# Patient Record
Sex: Female | Born: 1945 | Race: White | Hispanic: No | Marital: Single
Health system: Southern US, Community
[De-identification: ages and names within clinical notes are randomized; demographics above are authoritative.]

---

## 2005-06-22 ENCOUNTER — Ambulatory Visit: Payer: Self-pay | Admitting: Internal Medicine

## 2005-10-23 ENCOUNTER — Other Ambulatory Visit: Payer: Self-pay

## 2005-10-23 ENCOUNTER — Emergency Department: Payer: Self-pay | Admitting: Emergency Medicine

## 2005-10-25 ENCOUNTER — Ambulatory Visit: Payer: Self-pay | Admitting: Emergency Medicine

## 2005-11-03 ENCOUNTER — Ambulatory Visit: Payer: Self-pay | Admitting: Internal Medicine

## 2007-11-19 ENCOUNTER — Emergency Department: Payer: Self-pay | Admitting: Emergency Medicine

## 2009-09-24 ENCOUNTER — Ambulatory Visit: Payer: Self-pay | Admitting: Internal Medicine

## 2009-09-28 ENCOUNTER — Inpatient Hospital Stay: Payer: Self-pay | Admitting: Internal Medicine

## 2009-10-24 ENCOUNTER — Ambulatory Visit: Payer: Self-pay | Admitting: Internal Medicine

## 2009-11-03 ENCOUNTER — Ambulatory Visit: Payer: Self-pay | Admitting: Internal Medicine

## 2009-11-24 ENCOUNTER — Ambulatory Visit: Payer: Self-pay | Admitting: Internal Medicine

## 2011-07-05 ENCOUNTER — Ambulatory Visit: Payer: Self-pay | Admitting: Internal Medicine

## 2011-07-09 ENCOUNTER — Inpatient Hospital Stay: Payer: Self-pay | Admitting: Specialist

## 2011-07-09 LAB — CBC
HGB: 13.2 g/dL (ref 12.0–16.0)
MCH: 28.8 pg (ref 26.0–34.0)
MCHC: 32.3 g/dL (ref 32.0–36.0)
MCV: 89 fL (ref 80–100)
Platelet: 338 10*3/uL (ref 150–440)
RBC: 4.57 10*6/uL (ref 3.80–5.20)
RDW: 16.7 % — ABNORMAL HIGH (ref 11.5–14.5)

## 2011-07-09 LAB — BASIC METABOLIC PANEL
BUN: 7 mg/dL (ref 7–18)
Co2: 35 mmol/L — ABNORMAL HIGH (ref 21–32)
Creatinine: 0.76 mg/dL (ref 0.60–1.30)
EGFR (African American): >60
EGFR (Non-African Amer.): >60
Glucose: 88 mg/dL (ref 65–99)
Potassium: 3.9 mmol/L (ref 3.5–5.1)
Sodium: 145 mmol/L (ref 136–145)

## 2011-07-09 LAB — CK TOTAL AND CKMB (NOT AT ARMC)
CK, Total: 26 U/L (ref 21–215)
CK-MB: 1.1 ng/mL (ref 0.5–3.6)

## 2011-07-09 LAB — TROPONIN I: Troponin-I: 0.05 ng/mL

## 2011-07-10 LAB — CBC WITH DIFFERENTIAL/PLATELET
Basophil #: 0 10*3/uL (ref 0.0–0.1)
Lymphocyte %: 24.5 %
MCHC: 31.7 g/dL — ABNORMAL LOW (ref 32.0–36.0)
Monocyte %: 1.3 %
Neutrophil #: 5 10*3/uL (ref 1.4–6.5)
Neutrophil %: 74.1 %
Platelet: 325 10*3/uL (ref 150–440)
RBC: 4.22 10*6/uL (ref 3.80–5.20)
RDW: 16.5 % — ABNORMAL HIGH (ref 11.5–14.5)
WBC: 6.8 10*3/uL (ref 3.6–11.0)

## 2011-07-10 LAB — BASIC METABOLIC PANEL
BUN: 7 mg/dL (ref 7–18)
Chloride: 103 mmol/L (ref 98–107)
Co2: 31 mmol/L (ref 21–32)
Creatinine: 0.71 mg/dL (ref 0.60–1.30)
Glucose: 138 mg/dL — ABNORMAL HIGH (ref 65–99)
Osmolality: 285 (ref 275–301)
Potassium: 4.2 mmol/L (ref 3.5–5.1)
Sodium: 143 mmol/L (ref 136–145)

## 2011-07-10 LAB — TROPONIN I: Troponin-I: 0.03 ng/mL

## 2011-07-10 LAB — CK TOTAL AND CKMB (NOT AT ARMC): CK-MB: 2.1 ng/mL (ref 0.5–3.6)

## 2011-07-11 LAB — CBC WITH DIFFERENTIAL/PLATELET
Basophil %: 0.2 %
Eosinophil #: 0 10*3/uL (ref 0.0–0.7)
Eosinophil %: 0 %
HCT: 36.7 % (ref 35.0–47.0)
Lymphocyte %: 15.1 %
MCV: 89 fL (ref 80–100)
Neutrophil #: 8.9 10*3/uL — ABNORMAL HIGH (ref 1.4–6.5)
Neutrophil %: 80.3 %
Platelet: 363 10*3/uL (ref 150–440)
RBC: 4.14 10*6/uL (ref 3.80–5.20)
WBC: 11.1 10*3/uL — ABNORMAL HIGH (ref 3.6–11.0)

## 2011-07-11 LAB — BASIC METABOLIC PANEL
Anion Gap: 6 — ABNORMAL LOW (ref 7–16)
Calcium, Total: 8.7 mg/dL (ref 8.5–10.1)
Co2: 33 mmol/L — ABNORMAL HIGH (ref 21–32)
Creatinine: 0.82 mg/dL (ref 0.60–1.30)
EGFR (African American): >60
Osmolality: 280 (ref 275–301)
Sodium: 140 mmol/L (ref 136–145)

## 2011-07-12 LAB — BASIC METABOLIC PANEL
Anion Gap: 6 — ABNORMAL LOW (ref 7–16)
Calcium, Total: 8.6 mg/dL (ref 8.5–10.1)
Chloride: 99 mmol/L (ref 98–107)
Osmolality: 282 (ref 275–301)
Potassium: 3.5 mmol/L (ref 3.5–5.1)

## 2011-07-14 LAB — CBC WITH DIFFERENTIAL/PLATELET
Basophil #: 0 10*3/uL (ref 0.0–0.1)
Basophil %: 0.4 %
Eosinophil %: 0 %
HGB: 12.1 g/dL (ref 12.0–16.0)
Lymphocyte #: 1.9 10*3/uL (ref 1.0–3.6)
MCH: 28.6 pg (ref 26.0–34.0)
MCV: 88 fL (ref 80–100)
Monocyte #: 0.7 10*3/uL (ref 0.0–0.7)
Monocyte %: 8.4 %
Neutrophil #: 6.1 10*3/uL (ref 1.4–6.5)
Neutrophil %: 69.3 %
RBC: 4.24 10*6/uL (ref 3.80–5.20)
WBC: 8.9 10*3/uL (ref 3.6–11.0)

## 2011-07-15 LAB — BASIC METABOLIC PANEL
Anion Gap: 10 (ref 7–16)
Calcium, Total: 8.3 mg/dL — ABNORMAL LOW (ref 8.5–10.1)
Chloride: 92 mmol/L — ABNORMAL LOW (ref 98–107)
Co2: 38 mmol/L — ABNORMAL HIGH (ref 21–32)
EGFR (African American): >60
EGFR (Non-African Amer.): >60
Glucose: 73 mg/dL (ref 65–99)
Osmolality: 282 (ref 275–301)
Potassium: 2.7 mmol/L — ABNORMAL LOW (ref 3.5–5.1)

## 2011-07-16 LAB — BASIC METABOLIC PANEL
Anion Gap: 10 (ref 7–16)
BUN: 31 mg/dL — ABNORMAL HIGH (ref 7–18)
Chloride: 94 mmol/L — ABNORMAL LOW (ref 98–107)
Co2: 39 mmol/L — ABNORMAL HIGH (ref 21–32)
Creatinine: 0.89 mg/dL (ref 0.60–1.30)
EGFR (African American): >60
EGFR (Non-African Amer.): >60
Glucose: 76 mg/dL (ref 65–99)
Osmolality: 290 (ref 275–301)
Sodium: 143 mmol/L (ref 136–145)

## 2011-07-16 LAB — CULTURE, BLOOD (SINGLE)

## 2011-07-17 LAB — CBC WITH DIFFERENTIAL/PLATELET
Basophil #: 0.1 10*3/uL (ref 0.0–0.1)
Basophil %: 0.5 %
Eosinophil #: 0.2 10*3/uL (ref 0.0–0.7)
Eosinophil %: 1.9 %
HCT: 37.6 % (ref 35.0–47.0)
HGB: 12.2 g/dL (ref 12.0–16.0)
MCH: 28.6 pg (ref 26.0–34.0)
MCHC: 32.4 g/dL (ref 32.0–36.0)
MCV: 88 fL (ref 80–100)
Monocyte #: 1 10*3/uL — ABNORMAL HIGH (ref 0.0–0.7)
Neutrophil #: 6.1 10*3/uL (ref 1.4–6.5)
Neutrophil %: 53.3 %
RBC: 4.25 10*6/uL (ref 3.80–5.20)
RDW: 15.7 % — ABNORMAL HIGH (ref 11.5–14.5)

## 2011-07-17 LAB — BASIC METABOLIC PANEL
Anion Gap: 8 (ref 7–16)
Calcium, Total: 8.6 mg/dL (ref 8.5–10.1)
Chloride: 95 mmol/L — ABNORMAL LOW (ref 98–107)
Co2: 36 mmol/L — ABNORMAL HIGH (ref 21–32)
Glucose: 81 mg/dL (ref 65–99)
Osmolality: 283 (ref 275–301)
Potassium: 3.4 mmol/L — ABNORMAL LOW (ref 3.5–5.1)

## 2011-11-14 ENCOUNTER — Ambulatory Visit: Payer: Self-pay | Admitting: Internal Medicine

## 2012-06-24 ENCOUNTER — Ambulatory Visit: Payer: Self-pay | Admitting: Internal Medicine

## 2013-10-09 ENCOUNTER — Ambulatory Visit: Payer: Self-pay | Admitting: Internal Medicine

## 2014-01-02 ENCOUNTER — Ambulatory Visit: Payer: Self-pay | Admitting: Family Medicine

## 2014-01-09 ENCOUNTER — Emergency Department: Payer: Self-pay | Admitting: Emergency Medicine

## 2014-01-09 LAB — DRUG SCREEN, URINE
Amphetamines, Ur Screen: NEGATIVE (ref ?–1000)
BENZODIAZEPINE, UR SCRN: POSITIVE (ref ?–200)
Barbiturates, Ur Screen: NEGATIVE (ref ?–200)
COCAINE METABOLITE, UR ~~LOC~~: NEGATIVE (ref ?–300)
Cannabinoid 50 Ng, Ur ~~LOC~~: NEGATIVE (ref ?–50)
MDMA (Ecstasy)Ur Screen: NEGATIVE (ref ?–500)
Methadone, Ur Screen: NEGATIVE (ref ?–300)
Opiate, Ur Screen: POSITIVE (ref ?–300)
PHENCYCLIDINE (PCP) UR S: NEGATIVE (ref ?–25)
Tricyclic, Ur Screen: NEGATIVE (ref ?–1000)

## 2014-01-09 LAB — COMPREHENSIVE METABOLIC PANEL
ALK PHOS: 103 U/L
ANION GAP: 4 — AB (ref 7–16)
Albumin: 4.1 g/dL (ref 3.4–5.0)
BUN: 13 mg/dL (ref 7–18)
Bilirubin,Total: 0.5 mg/dL (ref 0.2–1.0)
CHLORIDE: 104 mmol/L (ref 98–107)
CREATININE: 1.29 mg/dL (ref 0.60–1.30)
Calcium, Total: 9.2 mg/dL (ref 8.5–10.1)
Co2: 27 mmol/L (ref 21–32)
EGFR (Non-African Amer.): 42 — ABNORMAL LOW
GFR CALC AF AMER: 49 — AB
GLUCOSE: 87 mg/dL (ref 65–99)
Osmolality: 270 (ref 275–301)
Potassium: 3.4 mmol/L — ABNORMAL LOW (ref 3.5–5.1)
SGOT(AST): 56 U/L — ABNORMAL HIGH (ref 15–37)
SGPT (ALT): 33 U/L (ref 12–78)
SODIUM: 135 mmol/L — AB (ref 136–145)
TOTAL PROTEIN: 7.9 g/dL (ref 6.4–8.2)

## 2014-01-09 LAB — CBC
HCT: 32.1 % — ABNORMAL LOW (ref 35.0–47.0)
HGB: 10.3 g/dL — AB (ref 12.0–16.0)
MCH: 23.4 pg — ABNORMAL LOW (ref 26.0–34.0)
MCHC: 32.1 g/dL (ref 32.0–36.0)
MCV: 73 fL — ABNORMAL LOW (ref 80–100)
PLATELETS: 629 10*3/uL — AB (ref 150–440)
RBC: 4.4 10*6/uL (ref 3.80–5.20)
RDW: 18 % — ABNORMAL HIGH (ref 11.5–14.5)
WBC: 12.7 10*3/uL — ABNORMAL HIGH (ref 3.6–11.0)

## 2014-01-09 LAB — URINALYSIS, COMPLETE
Bilirubin,UR: NEGATIVE
Blood: NEGATIVE
GLUCOSE, UR: NEGATIVE mg/dL (ref 0–75)
KETONE: NEGATIVE
Leukocyte Esterase: NEGATIVE
NITRITE: NEGATIVE
PH: 6 (ref 4.5–8.0)
Protein: NEGATIVE
RBC,UR: <1 /HPF (ref 0–5)
Specific Gravity: 1.006 (ref 1.003–1.030)
WBC UR: <1 /HPF (ref 0–5)

## 2014-01-09 LAB — SALICYLATE LEVEL: Salicylates, Serum: 4.3 mg/dL — ABNORMAL HIGH

## 2014-01-09 LAB — ETHANOL: Ethanol %: <0.003 % (ref 0.000–0.080)

## 2014-01-09 LAB — ACETAMINOPHEN LEVEL: Acetaminophen: 3 ug/mL — ABNORMAL LOW

## 2014-01-11 LAB — TROPONIN I
Troponin-I: <0.02 ng/mL
Troponin-I: <0.02 ng/mL

## 2014-01-30 ENCOUNTER — Emergency Department: Payer: Self-pay | Admitting: Emergency Medicine

## 2014-01-30 LAB — COMPREHENSIVE METABOLIC PANEL
ALK PHOS: 81 U/L
ANION GAP: 9 (ref 7–16)
AST: 19 U/L (ref 15–37)
Albumin: 3.8 g/dL (ref 3.4–5.0)
BUN: 19 mg/dL — ABNORMAL HIGH (ref 7–18)
Bilirubin,Total: 0.2 mg/dL (ref 0.2–1.0)
CHLORIDE: 102 mmol/L (ref 98–107)
Calcium, Total: 9.1 mg/dL (ref 8.5–10.1)
Co2: 30 mmol/L (ref 21–32)
Creatinine: 1.15 mg/dL (ref 0.60–1.30)
EGFR (African American): 56 — ABNORMAL LOW
GFR CALC NON AF AMER: 49 — AB
Glucose: 109 mg/dL — ABNORMAL HIGH (ref 65–99)
OSMOLALITY: 284 (ref 275–301)
POTASSIUM: 3.6 mmol/L (ref 3.5–5.1)
SGPT (ALT): 21 U/L
Sodium: 141 mmol/L (ref 136–145)
Total Protein: 7.2 g/dL (ref 6.4–8.2)

## 2014-01-30 LAB — CBC
HCT: 29.2 % — AB (ref 35.0–47.0)
HGB: 9.1 g/dL — AB (ref 12.0–16.0)
MCH: 22.3 pg — ABNORMAL LOW (ref 26.0–34.0)
MCHC: 31.3 g/dL — ABNORMAL LOW (ref 32.0–36.0)
MCV: 71 fL — ABNORMAL LOW (ref 80–100)
PLATELETS: 649 10*3/uL — AB (ref 150–440)
RBC: 4.09 10*6/uL (ref 3.80–5.20)
RDW: 17.6 % — AB (ref 11.5–14.5)
WBC: 14.2 10*3/uL — ABNORMAL HIGH (ref 3.6–11.0)

## 2014-01-30 LAB — ACETAMINOPHEN LEVEL: Acetaminophen: <2 ug/mL

## 2014-01-30 LAB — ETHANOL: Ethanol: <3 mg/dL

## 2014-01-30 LAB — SALICYLATE LEVEL: Salicylates, Serum: <1.7 mg/dL

## 2014-01-31 LAB — URINALYSIS, COMPLETE
BACTERIA: NONE SEEN
Bilirubin,UR: NEGATIVE
Blood: NEGATIVE
GLUCOSE, UR: NEGATIVE mg/dL (ref 0–75)
Ketone: NEGATIVE
Leukocyte Esterase: NEGATIVE
Nitrite: NEGATIVE
Ph: 8 (ref 4.5–8.0)
Protein: NEGATIVE
RBC,UR: NONE SEEN /HPF (ref 0–5)
Specific Gravity: 1.004 (ref 1.003–1.030)
WBC UR: NONE SEEN /HPF (ref 0–5)

## 2014-01-31 LAB — DRUG SCREEN, URINE

## 2014-02-02 LAB — TROPONIN I: Troponin-I: <0.02 ng/mL

## 2014-10-17 NOTE — Consult Note (Signed)
Brief Consult Note: Diagnosis: Bipolar Manic.   Patient was seen by consultant.   Recommend further assessment or treatment.   Comments: Pt seen in ED BHU. She appeared somewhat calm and has been responding well to the medication. She stated that she slept well and is not having any dizziness at this time. She continues to be verbose with some anxiety noted. She denied SI/HI or plans at this time. She has tolerated the Seroquel well and no adverse effects noted. She was taking Xanax in the past for anxiety.  Plan: Will add Buspar for anxiety. Continue Seroquel 200mg  po qhs Will continue to monitor.  Awaiting placement at geropsych unit.  Electronic Signatures: Rhunette CroftFaheem, Ulysse Siemen S (MD)  (Signed 23-Jul-15 11:32)  Authored: Brief Consult Note   Last Updated: 23-Jul-15 11:32 by Rhunette CroftFaheem, Achillies Buehl S (MD)

## 2014-10-17 NOTE — Consult Note (Signed)
PATIENT NAME:  Ashley Ayers, Ashley Ayers MR#:  [id number] DATE OF BIRTH:  10/20/1945  DATE OF CONSULTATION:  01/11/2014  CONSULTING PHYSICIAN:  Neeley Sedivy K. Exodus Kutzer, MD  SEX: Female.  RACE: White.  SUBJECTIVE: The patient was seen on consultation, room number 21A at [facility]. 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 The patient is divorced for many years and has no children. The patient reports that she has been living in a mixed neighborhood with several other people. The patient reports that they diagnosed her with COPD 6 months ago and put her on oxygen and then they came to know that she was allergic to cats and her roommate had a cat. The patient is currently using oxygen. According to information obtained from the chart, patient has been delusional, psychotic, and paranoid. The patient reports that she had to go back to the place as she is concerned about her problems related to allergies towards cats.  ALLERGIES: CATS.   PAST PSYCHIATRIC HISTORY: History of inpatient hold on psychiatry many years ago for depression and psychiatric problems. No history of suicide attempts, not being followed by any psychiatrist.   MENTAL STATUS EXAMINATION: The patient is alert and oriented to place, person, and time. Denies feeling depressed, denies feeling hopeless, helpless. Denies auditory or visual hallucinations. Denies any paranoid or suspicious ideas, could count money. She could spell the word world forward but had difficulty spelling it backward, memory and recall are fair and regarding judgment for fire, she said she will try to follow the instructions by the staff here. Insight and judgement are guarded. Impulse control is poor.   IMPRESSION: Psychosis, not otherwise specified, and delusional disorder to be ruled out.  RECOMMENDATION: Inpatient hospital  in   psychiatry   where geriatric healthcare is available and where oxygen will be accepted.  Placement is being looked into for appropriate admission and help as needed.   ____________________________ Jannet MantisSurya K. Guss Bundehalla, MD skc:lt D: 01/11/2014 19:15:19 ET T: 01/11/2014 23:29:54 ET JOB#: [id number]  cc: Monika SalkSurya K. Guss Bundehalla, MD, <Dictator> Beau FannySURYA K Gerritt Galentine MD ELECTRONICALLY SIGNED 01/17/2014 18:06

## 2014-10-17 NOTE — Consult Note (Signed)
PATIENT NAME:  Ashley Ayers, FETTING MR#:  [id number] DATE OF BIRTH:  Sep 16, 1945  DATE OF CONSULTATION:  01/31/2014  CONSULTING PHYSICIAN:  Tiawanna Luchsinger K. Guss Bunde, MD  PLACE OF DICTATION:  [facility], #22A, [place], [place].  AGE:  69 years.   SEX:  Female.  RACE: White  SUBJECTIVE:  The patient was seen in consultation, room #22A, [facility].  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  The patient reports her marriage was annulled and she has no children.  She has been living by herself in a room that she rents from a lady in [place] around [place], and she has been living in this situation for 1 year and she calls it a good deal as she rents a room for $400 from a lady.  According to information obtained from the chart and from the staff, the patient had been inpatient in [facility] where she was observed for 7 days and was discharged and on her way back, she started talking "out of her head."  According to the information obtained, the patient had been paranoid and delusional.    PAST PSYCHIATRIC HISTORY:  History of bipolar disorder wherein the patient was diagnosed bipolar Disorder years and she reports that she is suffering from bipolar disorder and she has been taking care of it for many years, and in fact, she is being followed by Dr. Janeece Riggers at [facility] in [place], [place].  She is being followed by him on a regular basis for many years and she has to understand to call him and find out the details that she takes all the medications that are prescribed by him.  No history of suicide attempts.    ALCOHOL AND DRUGS:  Denied.  Does admit smoking nicotine cigarettes, which he just quit and she stated that she just quit smoking nicotine cigarettes because of her COPD and being on oxygen and she wanted undersigned to call Dr. Janeece Riggers and tell him so.  MENTAL  STATUS EXAMINATION:  The patient is dressed in hospital scrubs alert and oriented to place, person and time.  Pressured speech with racing thoughts.  When her age was asked, she stated that date of birth was 08/01/1945 and she smiled and stated that this was her birthday month.  She gets irritable and agitated when questions are asked.  Not able to focus, pay attention.  The same question had to be asked several times.  Denies feeling depressed.  Denies feeling hopeless or helpless.  Admits that she is paranoid and she is always that way and whenever she gets paranoid, she tries to check it out.  She tries to find out why she is paranoid and she is delusional about people around and does not trust many people around.  Cognition is intact.  She knew [address], [place], name of the current president.  Speech is pressured and jumps from 1 subject to the other.  Insight and judgment guarded.  Impulse control is poor, though she says she does not have any suicidal or homicidal ideas or plan, but she reports that she does not trust herself.  IMPRESSION:  Bipolar disorder type 1 with manic episode and psychosis.  Recommend inpatient hospital and psychiatry to a geriatric facility where the patient can be stabilized.  Continue current medications.   ____________________________ Jannet Mantis. Guss Bunde, MD skc:ds  D: 01/31/2014 19:49:22 ET T: 01/31/2014 20:37:24 ET JOB#: [id number]  cc: Monika SalkSurya K. Guss Bundehalla, MD, <Dictator> Beau FannySURYA K Ahmeer Tuman MD ELECTRONICALLY SIGNED 02/01/2014 19:21

## 2014-10-17 NOTE — Consult Note (Signed)
Psychiatry: Follow-up note for this patient with bipolar disorder.  Today she was trying to sleep in the afternoon but was easy to arouse.  She has no new complaints.  She appeared to still be psychotic and disorganized in her thinking.  Was rambling.  Talking about grandiose and bizarre things. Seroquel to 300 mg at night for psychosis with bipolar mania.  Patient needs admission to geriatric psychiatry unit.  Recommend the process of referral be continued.  Electronic Signatures: Audery Amellapacs, John T (MD)  (Signed on 24-Jul-15 16:42)  Authored  Last Updated: 24-Jul-15 16:42 by Audery Amellapacs, John T (MD)

## 2014-10-17 NOTE — Consult Note (Signed)
PATIENT NAME:  Ashley Ayers, [name] MR#:  [id number] DATE OF BIRTH:  May 08, 1946  DATE OF CONSULTATION:  01/14/2014  REFERRING PHYSICIAN:   CONSULTING PHYSICIAN:  Audery AmelJohn T. Clapacs, MD  IDENTIFYING INFORMATION AND REASON FOR CONSULT: This is a 69 year old woman currently under involuntary commitment in the Emergency Room with reports that she has been threatening, psychotic, acting bizarre, talking about shooting people. She was petitioned by someone in her neighborhood. She was seen a couple of days ago by Dr. Guss Bundehalla, who believed that she was psychotic and recommended that the patient be referred to a geriatric psychiatry hospital. I checked up with the patient today and she told me that she was "manic depressive." She seemed to have pretty good insight about it saying that she still knew that she needed to be in the hospital and needed psychiatric treatment because her mind was not working right and she was manic. Unfortunately, she was not able to stay focused enough on conversation to really give me a clear history of it. She describes a lot of grandiose bizarre thinking and behavior at home. She has a hard time answering many specific questions. I asked her what medication she was currently taking, and after asking her several times, I finally got her to tell me that she is taking Seroquel, but she could not tell me the dose. She also apparently is taking mirtazapine, but does not know any other medicines she is taking. She claims that she is still seeing Dr. [name] for outpatient treatment, but cannot remember when she last saw him. She tells me that she did not used to have a substance abuse problem, but recently she has been drinking. Denies any other drug use.   PAST PSYCHIATRIC HISTORY: She has been seen in consult once before at our hospital, at which time, Dr. (Dictation Anomaly) << MISSING TEXT>> saw her as primarily depressed. The patient, herself, tells me today that she has had mental problems since  she was a child and has been told that she was bipolar and schizophrenic at various times. She has been on a wide range of antipsychotics, antidepressants and mood stabilizing medicines over the years. She is not really able to tell me exactly what medicines or whether any of it was particularly helpful. Denies ever having tried to kill herself.   PAST MEDICAL HISTORY: She has chronic obstructive pulmonary disease and is oxygen dependent now. She also has high blood pressure. History of heart disease, not clear whether she has been compliant with her medicine.   SOCIAL HISTORY: She tells a rambling story about how various people threw her out of the house. It is kind of hard to put the whole thing together, sounds like she has been living alone, recently. She reportedly had been threatening to shoot people in her neighborhood. Whether she actually has firearms in her home or not is unclear.   SUBSTANCE ABUSE HISTORY: As noted previously, she had denied this in the past, but says recently she has been drinking. Her drug screen is positive for opiates and benzodiazepines.   FAMILY HISTORY: She says that everyone in her family has "manic depression, schizogenic."   CURRENT MEDICATIONS: I am not sure how reliable this current list is, but we were told that she gets amlodipine 10 mg a day, aspirin 325 mg a day, fluoxetine 40 mg a day, Remeron 30 mg at night, potassium chloride 20 mEq a day, quetiapine 100 mg at night. Other medicines here, it is unclear, and she  is not able to tell me.   ALLERGIES: BACTRIM, CECLOR AND MORPHINE.   REVIEW OF SYSTEMS: She denies any acute physical symptoms. Denies any suicidal or homicidal ideation. Denies hallucinations. Admits that she feels like she has racing thoughts, and that she feels "manic."   MENTAL STATUS EXAMINATION: Thin older woman who looks older than her stated age. Very cooperative and enthusiastic about the interview. Intense eye contact. Fidgety  psychomotor activity. Rapid, pressured speech. Affect smiling and somewhat labile, a little euphoric. Mood stated as being manic. Thoughts are disorganized with a lot of a flight of ideas. Some grandiose thinking. Denies hallucinations. Denies suicidal or homicidal ideation. Judgment and insight at least somewhat retained in that she knows she has a mental illness. She is alert and oriented x 4. Could not participate in other cognitive testing.   LABORATORY RESULTS: Her drug screen on admission was positive for benzodiazepines and opiates. Chemistry panel: Low sodium 135, low potassium 3.4. Alcohol level negative.   ASSESSMENT: This is a 69 year old woman,  who to my examination, appears to have bipolar disorder, currently manic. She has been treated with antidepressants, which would not typically be the best treatment for this condition. According to the commitment paperwork, she has been dangerous in the community and I believe that. She still is manic at this point. I would admit her to the hospital, but unfortunately she is oxygen dependent.   TREATMENT PLAN: I have increased her Seroquel to 200 mg at night and stopped the Remeron and Prozac. She needs to be referred to a geriatric psychiatry unit.   DIAGNOSIS, PRINCIPAL AND PRIMARY:  AXIS I: Bipolar disorder type 1, manic.   SECONDARY DIAGNOSES:  AXIS I: Deferred.  AXIS II: Deferred.  AXIS III: Chronic obstructive pulmonary disease, high blood pressure.  AXIS IV: Moderate to severe from what seems like isolation.  AXIS V: Functioning at the time of evaluation, 30.    ____________________________ Audery Amel, MD jtc:jr D: 01/14/2014 16:12:00 ET T: 01/14/2014 16:31:31 ET JOB#: [id number]  cc: Audery Amel, MD, <Dictator> Audery Amel MD ELECTRONICALLY SIGNED 02/04/2014 0:38

## 2014-10-18 NOTE — Discharge Summary (Signed)
PATIENT NAME:  Ashley Ayers, Ashley Ayers MR#:  [id number] DATE OF BIRTH:  1945-07-24  DATE OF ADMISSION:  07/09/2011 DATE OF DISCHARGE:  07/23/2011  For a detailed note, please see the History and Physical done by Dr. Luberta Mutter.    Please also look at the interim discharge summary done by Dr. Lafayette Dragon, which covers hospital course from 01/13 until 01/20. This covers the hospital course from 01/20 until the day of discharge, 01/27.   DIAGNOSES AT DISCHARGE:  1. Acute on chronic respiratory failure secondary to underlying pneumonia and chronic obstructive pulmonary disease exacerbation.  2. Pneumonia, failed outpatient antibiotics, now much improved.  3. History of tobacco abuse.  4. History of hypertension.  5. History of diastolic congestive heart failure.  6. Schizoaffective disorder.  7. Hypokalemia, now resolved.  8. 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 Meredeth Ide.   CONSULTANTS DURING HOSPITAL COURSE: Dr. Meredeth Ide from pulmonary.  DISCHARGE MEDICATIONS:  1. Xanax 0.5 mg q.i.d. as needed.  2. Remeron 15 mg at bedtime.  3. Seroquel 50 mg at bedtime.  4. Aspirin 325 mg daily.  5. Advair 250/50 1 puff b.i.d.  6. Prozac 40 mg daily.  7. Multivitamin daily.  8. Toprol 25 mg daily.  9. Spiriva 1 puff daily.  10. Lasix 20 mg daily.  11. Daliresp 500 mcg daily.  12. Bacitracin neomycin topically b.i.d. times five days applied to the right ear.   DIET: The patient is being discharged on a low-sodium diet.   ACTIVITY: As tolerated.   FOLLOWUP: Follow up with Dr. Dewaine Oats in the next 1 to 2 weeks.   PERTINENT STUDIES: CT scan of the chest done on 07/09/2011 showing no pulmonary embolism. New and increased heterogeneous opacities in the periphery of the right upper lobe and right lower lobe, some of which demonstrate some spiculation. These are nonspecific and may represent infection superimposed on background fibrotic changes. Short-term  followup is recommended with a repeat CT in 2 to 3 months to ensure clearing. Mild enlarged right hilar lymph nodes which are nonspecific, may be reactive. 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   HOSPITAL COURSE:  1. Acute on chronic respiratory failure: The patient presented to the hospital with shortness of breath, cough, fever ongoing for the past month or so. She had failed outpatient p.o. antibiotic therapy for pneumonia. She was started on IV ceftriaxone and Zithromax in the hospital. She received 10 days of IV antibiotic therapy and also aggressive IV steroids and around-the-clock nebulizer treatments. After getting aggressive therapy, the patient's clinical symptoms significantly improved. She did qualify for oxygen therapy. Therefore she will be discharged on oxygen. She has also finished her prednisone taper for her chronic obstructive pulmonary disease and pneumonia. As mentioned, she did have a CT scan which showed some questionable findings with some spiculation, and she needs a follow-up CT in next 3 to 4 months.  2. Chronic obstructive pulmonary disease exacerbation: This was secondary to possible underlying pneumonia. She was treated aggressively with 10 days of IV antibiotic therapy along with IV steroids, around-the-clock nebulizer treatments and maintained on some Symbicort and Spiriva and also Daliresp. Clinical symptoms have significantly improved. She did require home oxygen and therefore will be discharged on oxygen therapy. She is currently is being discharged on maintenance medications for chronic obstructive pulmonary disease including Advair and Spiriva and Daliresp. As mentioned, she has finished antibiotic therapy and also p.o. prednisone taper.  3. Hypertension: The patient  has remained hemodynamically stable on some Toprol which she will continue.   4. Hyperglycemia: This was secondary to steroids. It has significantly improved since her steroids have been discontinued.   5. History of schizoaffective disorder: The patient has been maintained on her medications including her Seroquel, Remeron, Prozac, and p.r.n. Xanax. She will resume these at the skilled nursing facility.  6. Hypokalemia: This is secondary to Lasix therapy. It has improved with supplementation and currently now resolved.  7. Right otitis externa: The patient does have some redness to the right ear. She was started on some polymyxin bacitracin neomycin topical antibiotic yesterday. She will do that for the next five days to treat right otitis externa. She has remained afebrile and stable regarding this. She did not have any evidence of otitis media on exam.  8. History of diastolic congestive heart failure: During her respiratory failure in the hospital when the patient was not improving with conventional therapy, she did undergo echocardiogram which showed normal LV function. There was some concern that she may have some diastolic congestive heart failure from her severe chronic obstructive pulmonary disease. Therefore, she was given some pulse doses of Lasix to which she responded well and they were able to wean down her oxygen. She is currently being maintained on some Lasix 20 mg daily, which she will continue. She does not appear to be volume overloaded and does not appear to have any evidence of congestive heart failure presently.  9. CODE STATUS: The patient is a FULL CODE. 10. The patient was seen by physical therapy and they recommended she go to a skilled nursing facility. Therefore, [facility] is being [facility].    TIME SPENT ON DISCHARGE: 45 minutes.  ____________________________ Rolly PancakeVivek J. Cherlynn KaiserSainani, MD vjs:bjt D: 07/23/2011 12:48:30 ET T: 07/23/2011 13:45:01 ET JOB#: [id number]  cc: Rolly PancakeVivek J. Cherlynn KaiserSainani, MD, <Dictator> Jillene Bucksenny C. Arlana Pouchate, MD Houston SirenVIVEK J Dionna Wiedemann MD ELECTRONICALLY SIGNED 07/29/2011 8:21

## 2014-10-18 NOTE — H&P (Signed)
PATIENT NAME:  Ashley Ayers, SCHRODT MR#:  [id number] DATE OF BIRTH:  06-Apr-1946  DATE OF ADMISSION:  07/09/2011  PRIMARY CARE PHYSICIAN: Dr. Dewaine Oats   CHIEF COMPLAINT: Trouble breathing. Patient was seen by primary doctor and was started on Levaquin.   HISTORY OF PRESENT ILLNESS: 69 year old female with history of tobacco abuse, hypertension came in because of trouble breathing getting progressively worse for the past one month. Patient says that she is having trouble breathing for one month, getting worse in the last 2 to 3 days so she came to the ER today. Also has cough and pleuritic chest pain. She also noted some leg swelling with no orthopnea or PND. Complains of about 20 pounds weight loss in last one month. Patient was recently seen by primary doctor and was diagnosed with chronic obstructive pulmonary disease. Patient refused admission time according to documentation. Patient denies any chest pain. No dizziness. No anxiety and patient did not have any fever.   PAST MEDICAL HISTORY:  1. History of hypertension. 2. Chronic obstructive pulmonary disease. 3. Iron deficiency anemia. 4. 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  5. Admission in 2011 for pneumonia.  6. History of schizoaffective disorder.   PAST SURGICAL HISTORY: Significant for breast implants.   SOCIAL HISTORY: Still smokes. She used to smoke about a pack per day for 40 years, now she smokes about 1 to 2 cigarettes a day. No alcohol. No drugs.   ALLERGIES: Allergic to Ceclor, morphine. Morphine gives her hallucinations and rash. Ceclor give her hives.    FAMILY HISTORY: Significant for diabetes in mother. Father had history of stroke.    MEDICATIONS:  1. Advair Diskus 250/50, 1 puff b.i.d.  2. Xanax 0.5 mg p.o. t.i.d.  3. Amlodipine 5 mg daily. 4. Aspirin 325 mg p.o. daily.  5. Ativan 0.5 mg as needed. 6. Ferrex 150 mg p.o. daily.  7. Prozac 40 mg daily. 8. Singulair 10 mg  as needed.   10. Zyprexa 10 mg 0.5 mg in the morning and at night. Marland Kitchen   REVIEW OF SYSTEMS: CONSTITUTIONAL: No fever. Complains of weakness and weight loss, weight loss of 20 pounds in last one month. EYES: No blurred vision. ENT: No tinnitus. No epistaxis. No difficulty swallowing. RESPIRATORY: Has trouble breathing and lower extremity edema. Denies orthopnea or paroxysmal nocturnal dyspnea. GASTROINTESTINAL: No nausea. No vomiting. No abdominal pain. GENITOURINARY: No dysuria. ENDOCRINE: No polyuria, nocturia. INTEGUMENTARY: No skin rashes. MUSCULOSKELETAL: No joint pains. NEUROLOGIC: No numbness or weakness. PSYCH: Has anxiety and history of schizoaffective disorder.   PHYSICAL EXAMINATION: VITAL SIGNS: Temperature 98.2, pulse 85, respirations 20, blood pressure 108/86, saturation 92% right now she is on 2 liters, saturations were 88% on room air when she came and 94% on 2 liters.   GENERAL: She is alert, awake, oriented.   HEENT: Head atraumatic, normocephalic. Pupils are equally reacting to light. Extraocular movements intact. ENT: No tympanic membrane congestion. No turbinate hypertrophy. No oropharyngeal erythema.    NECK: Normal range of motion. No JVD. No carotid bruits.   CARDIOVASCULAR: S1 and S2 regular. No murmurs.   LUNGS: Patient has decreased breath sounds diffusely. Did not hear any wheeze. No rales.   ABDOMEN: Soft, nontender, nondistended. Bowel sounds present.   EXTREMITIES: Patient does have 1+ pedal edema bilaterally.   NEUROLOGIC: Oriented time, place, and person. No focal neurological deficits.   PSYCH: Somewhat anxious.   LABORATORY, DIAGNOSTIC AND RADIOLOGICAL DATA: Chest x-ray showed opacity in the  right mid and lower lungs and small right pleural effusion. Patient's CT of the chest showed no pulmonary emboli. New increased opacities in the periphery of right upper lobe, right lower lobe demonstrating spiculation, may be nonspecific but may represent infection  superimposed on background of fibrotic changes. Short-term follow up is recommended. CT also is recommended in 2 to 3 months for follow up of resolution and also malignancy is a differential because of areas of spiculation. Mildly enlarged lymph nodes are present. Moderate to severe centrilobar emphysema.   Patient's EKG showed accelerated junctional rhythm with 76 beats per minute, diffuse T wave inversions in V1 to V4.   WBC 8, hemoglobin 13.2, hematocrit 40.7, platelets 338. Electrolytes: Sodium 145, potassium 3.9, chloride 104, bicarbonate 35, BUN 7, creatinine 0.76, glucose 88.   Troponin 0.05. BNP 1142.   ASSESSMENT AND PLAN: 69 year old female patient with:  1. Shortness of breath with worsening right-sided pneumonia by the CAT scan. She does have chronic obstructive pulmonary disease and emphysema so this is acute on chronic respiratory failure likely because of pneumonia and chronic obstructive pulmonary disease flare. She is going to be admitted to hospitalist service for IV antibiotics with Rocephin, Zithromax along with steroids and nebulizers and oxygen. Patient may have underlying malignancy because of spiculation in the CAT scan and also she has some weight loss so we will request pulmonary consult.  2. History of trouble breathing with pedal edema and elevated BNP. Will request echocardiogram to evaluate for any congestive heart failure. Patient's CK and troponins will be monitored two times.  3. History of hypertension. She is on Norvasc. Norvasc can give pedal; edema and we can change Norvasc to metoprolol 25 mg b.i.d. and hold the Norvasc.  4. History of iron deficiency anemia. She is on iron supplements and her hemoglobin and hematocrits have been fine so will leave up to primary doctor.  5. History of schizoaffective disorder. She is on Prozac and Seroquel, continue that.  TIME SPENT ON HISTORY AND PHYSICAL: About 60 minutes.   ____________________________ Katha HammingSnehalatha Daizy Outen,  MD sk:cms D: 07/09/2011 20:26:22 ET T: 07/10/2011 06:55:34 ET JOB#: [id number]  cc: Katha HammingSnehalatha Terrel Nesheiwat, MD, <Dictator> Jillene Bucksenny C. Arlana Pouchate, MD Katha HammingSNEHALATHA Shirline Kendle MD ELECTRONICALLY SIGNED 08/08/2011 14:54

## 2014-10-18 NOTE — Consult Note (Signed)
Brief Consult Note: Diagnosis: 1. SOB in patient with COPD, pneumonia, elevated BNP.   Consult note dictated.   Discussed with Attending MD.   Comments: Patient with increased SOB, pneumonia, pedal edema, elevated BNP. She has has negative stress test in past, but will need to check LVEF and for diastolic dysfunction. CHF may be contributing to her SOB. Meds will be adjusted based on echo findings.  Electronic Signatures: Radene KneeKhan, Shaukat Ali (MD)   (Signed 14-Jan-13 13:30)  Co-Signer: Brief Consult Note Verta EllenManzi, Braycen Burandt A (PA-C)   (Signed 14-Jan-13 12:32)  Authored: Brief Consult Note  Last Updated: 14-Jan-13 13:30 by Radene KneeKhan, Shaukat Ali (MD)

## 2014-10-18 NOTE — Consult Note (Signed)
PATIENT NAME:  Ashley, Ayers MR#:  [id number] DATE OF BIRTH:  1946/04/07  DATE OF CONSULTATION:  07/10/2011  REFERRING PHYSICIAN:  Larena Glassman, MD  CONSULTING PHYSICIAN:  Verta Ellen, PA-C  REASON FOR CONSULTATION: Possible congestive heart failure.   HISTORY OF PRESENT ILLNESS: Ashley Ayers is a 69 year old white female who was brought to the hospital due to increased shortness of breath. The patient states that she had coughing and wheezing at the beginning of December and had lost about 20 pounds since the beginning of December to the end of December. She has had orthopnea and has required two pillows. She also complains of paroxysmal nocturnal dyspnea and decreased exercise tolerance in the last two years. She has been on antibiotics prior to this admission for suspected pneumonia.   The patient also complains of chronic chest pain for the last five years. The pain is sharp, located in the left chest, and does not radiate. Her chest pain seems to be worse when her blood pressure is up. She has associated shortness of breath but no diaphoresis. She states that she had a stress test done about two years ago that was ordered by Dr. Arlana Pouch that she believes was normal. She never had a cardiac catheterization. She has never been told in the past that she has a diagnosis of congestive heart failure.   PAST MEDICAL HISTORY:  1. Hypertension. 2. Chronic obstructive pulmonary disease.  3. Iron deficiency anemia.  4. History of thrombocytosis thought to be caused by Geodon.  5. Hospital admission for pneumonia in 2011.  6. Schizoaffective disorder.   PAST SURGICAL HISTORY:  1. Hysterectomy.  2. Breast augmentation.    SOCIAL HISTORY: The patient lives at a skilled nursing facility and has a roommate. She smokes. She used to smoke a pack per day for 40 years, now only smokes 1 to 2 cigarettes per day. She denies any alcohol. No illicit drug use.   FAMILY HISTORY: The patient's father  has a history of stroke. Father died in his 44's of a heart attack. Brother has a history of coronary artery disease and high blood pressure.   ALLERGIES: Ceclor (gives her hives), morphine (hallucinations).   HOME MEDICATIONS:  1. Advair Diskus 250/50 one puff twice daily (she was previously on Symbicort).  2. Xanax 0.5 mg p.o. 3 times daily.  3. Amlodipine 5 mg p.o. daily.  4. Aspirin 325 mg daily.  5. Ativan 0.5 mg as needed.  6. Ferrous 150 mg p.o. daily.  7. Prozac 40 mg p.o. daily.  8. Singulair 10 mg p.o. p.r.n.  9. Zyprexa, unknown dose, q.a.m. and at bedtime.   REVIEW OF SYSTEMS: Positive for weight loss, weakness, shortness of breath, pedal edema, orthopnea, paroxysmal nocturnal dyspnea, and anxiety.   PHYSICAL EXAMINATION:   GENERAL: This is an elderly female who is not in any acute distress. She is sitting up in bed and she is alert and oriented.   VITAL SIGNS: Temperature 97.6, heart rate 74, respiratory rate 24, blood pressure 119/59, oxygen saturation 91% on 4 liters per minute of oxygen via nasal cannula.   HEENT: Head atraumatic, normocephalic.   EYES: Pupils round, equal bilaterally to light. There is no scleral icterus. Conjunctivae are pale, pink.   EARS AND NOSE: Normal to external inspection.   MOUTH: Some dry mucous membranes.   NECK: Supple. Trachea is midline. Thyroid is smooth and mobile.   LUNGS: Diminished breath sounds bilaterally. No wheezes or rales appreciated.   CARDIOVASCULAR:  Regular rate and rhythm. No murmurs, rubs, or gallops.   ABDOMEN: Nondistended, soft, nontender to palpation. There is no rebound tenderness, guarding, peritoneal signs. No hepatosplenomegaly. Bowel sounds present.   EXTREMITIES: Trace pedal edema bilaterally.   ANCILLARY DATA: Glucose 138. B-type Natriuretic Peptide 1142. BUN 7, creatinine 0.71, sodium 143, potassium 4.2, chloride 103, CO2 31, anion gap 9, estimated GFR greater than 60, total calcium 8.4. CK 33. CK-MB  2.1. Troponin-I 0.03. White blood cell count 6.8, hemoglobin 12.0, hematocrit 37.8, platelet count 325,000. Arterial blood gas pH 7.24, pCO2 79, pO2 117, FiO2 100, HCO3 33.9. EKG on admission accelerated junctional rhythm, nonspecific ST changes consistent with septal infarct.   Chest x-ray from 07/08/2010 opacities in the right mid and lower lung which may be secondary to chronic interstitial lung disease, infection. Small right pleural effusion.   CT of the chest no pulmonary emboli identified, new and increased heterogeneous opacities in the periphery of the right upper lobe and right lower lobe some of which demonstrate some spiculation, mildly enlarged right hilar lymph nodes, moderate to severe centrilobular emphysema.   IMPRESSION:  1. Shortness of breath with worsening right-sided pneumonia by CT scan in a patient with chronic obstructive pulmonary disease/emphysema. The patient is currently receiving antibiotics and has improved. There is a possibility she may have some underlying malignancy because of spiculation on the CT scan and 20 pound weight loss. 2. Pedal edema with elevated BNP. The patient may have some underlying congestive heart failure and we will need to review echocardiogram and make further treatment recommendations based on the results.  3. Chronic sharp chest pain x5 years. The patient complains of sharp chest pain that does not radiate associated with shortness of breath. Her cardiac enzymes have been negative x3. Her last stress test was over two years ago. Consider repeat stress test as outpatient once the patient's respiratory status improves.  4. Hypertension, currently controlled on the current therapy.   Thank you very much for this consultation and allowing us to participate in the patient's care. This patient was discussed and the assessment and plan were reviewed with my supervising physician, Dr. Adrian BlackwaterShaukat Khan, who agreed with the above.    ____________________________ Verta EllenMonica A. Doshia Dalia, PA-C for Adrian BlackwaterShaukat Khan, MD mam:drc D: 07/10/2011 15:12:32 ET T: 07/11/2011 10:04:16 ET JOB#: [id number]  cc: Verta EllenMonica A. Kathreen Dileo, PA-C, <Dictator> Jeffery Gammell A Nole Robey PA ELECTRONICALLY SIGNED 07/11/2011 13:17

## 2016-03-30 IMAGING — CR DG CHEST 2V
3 series · 3 of 3 positions shown · non-contrast
Comparison: 10/09/2013

CLINICAL DATA: Shortness of breath

EXAM:
CHEST  2 VIEW

[chest pa (1 of 2)]
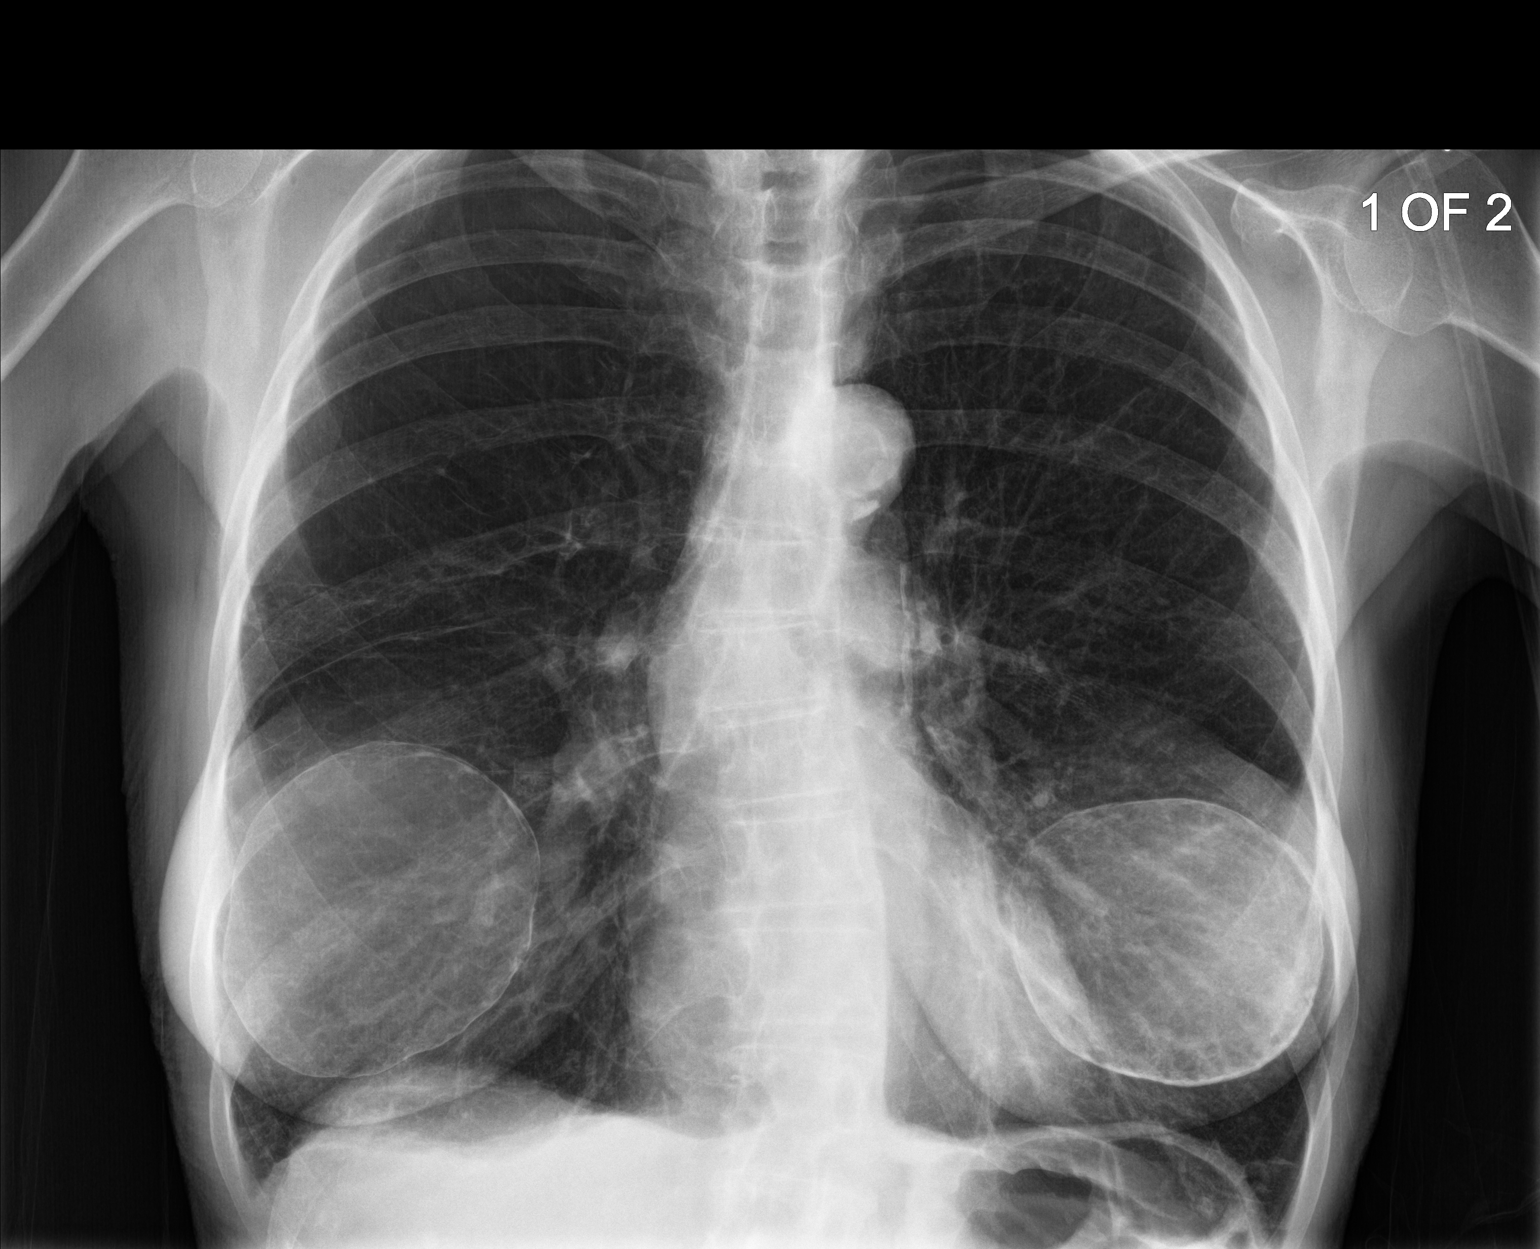

[chest lat]
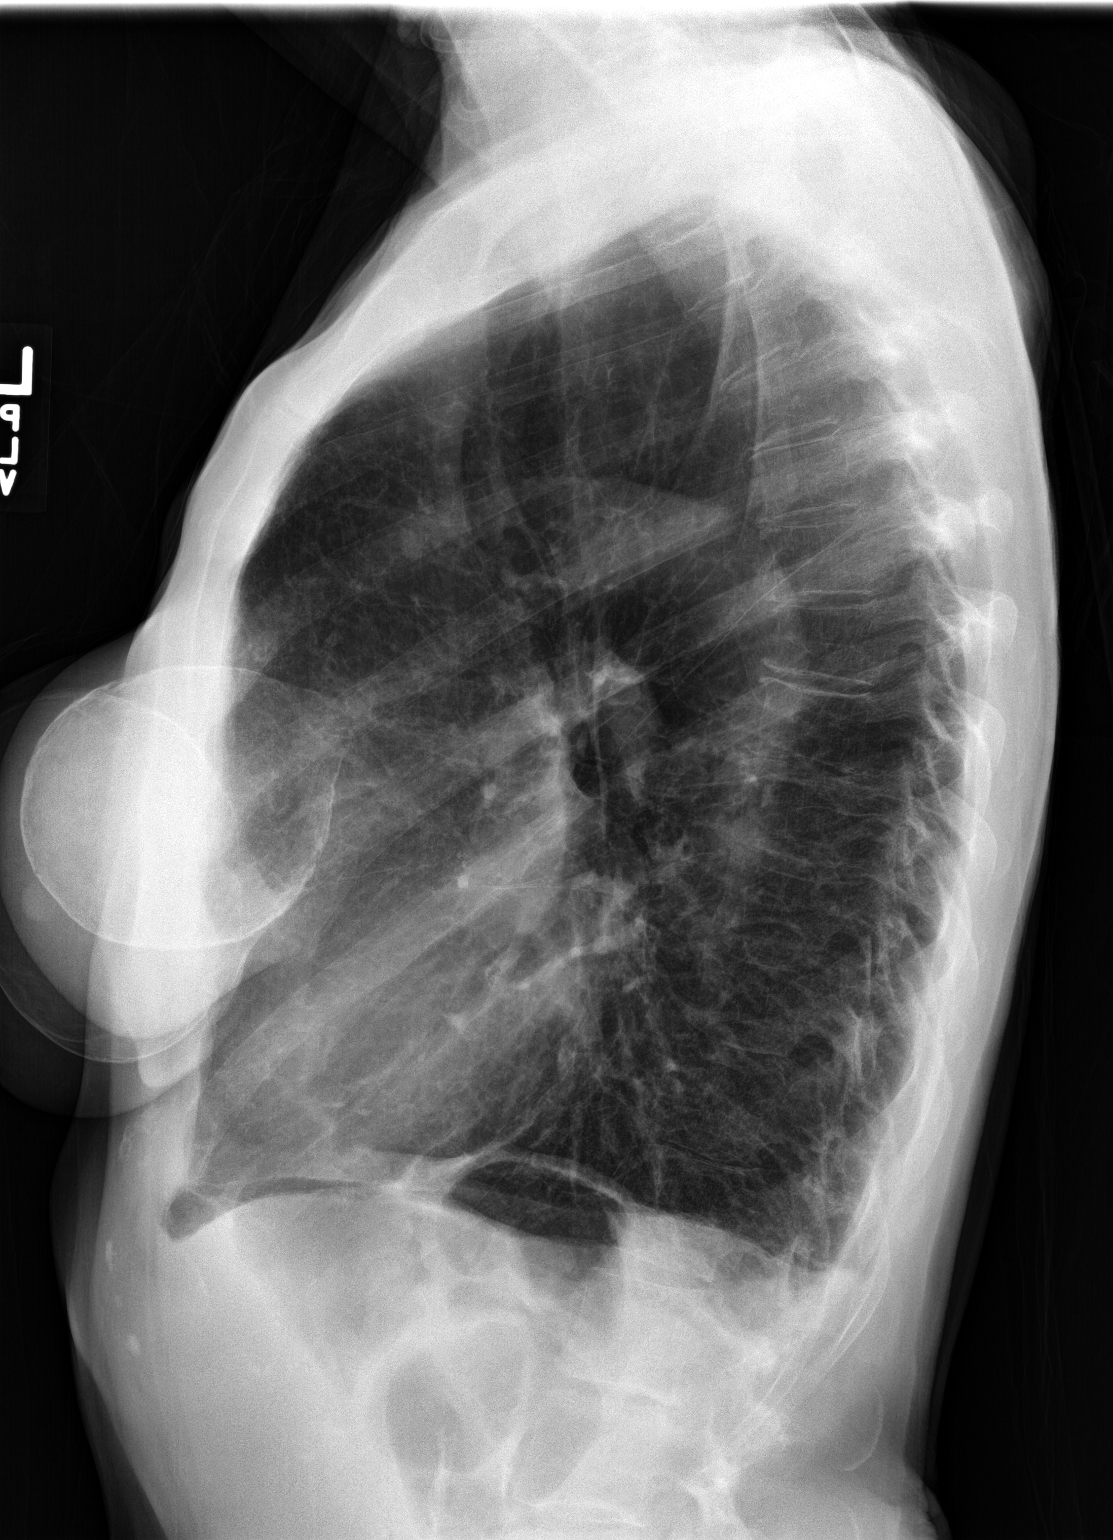

[chest pa (2 of 2)]
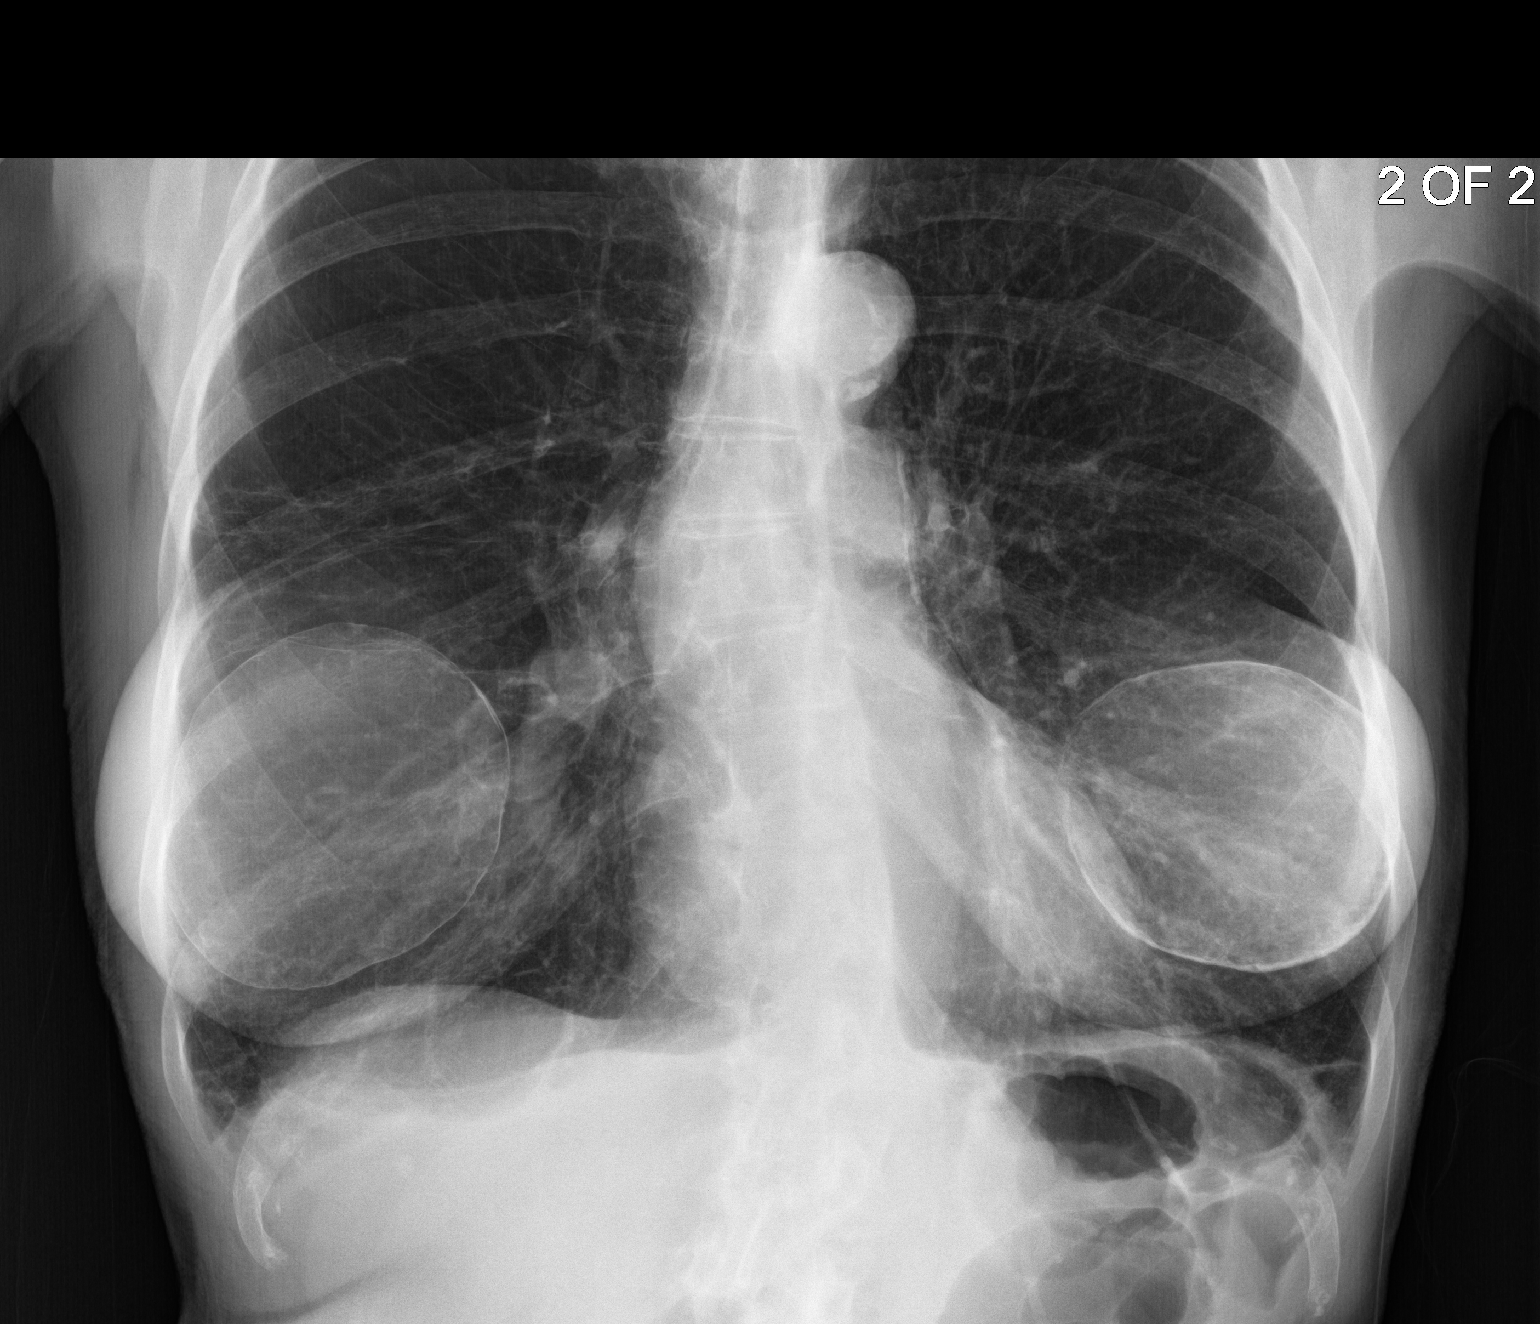

[3 of 3 positions shown; findings below may reference images not displayed]

FINDINGS: Cardiac shadow is stable. The lungs are again hyperinflated. No
focal mass lesion or infiltrate is noted no sizable effusion is
seen. Bilateral calcified breast implants are noted. The left
implant is slightly smaller than the right with some mild bulging
posteriorly on the lateral projection. This is slightly greater than
that seen on the prior exam. Clinical correlation is recommended.
IMPRESSION: Mild irregularity to the left breast implant.

COPD without acute abnormality.

## 2018-07-27 DEATH — deceased
